# Patient Record
Sex: Female | Born: 1940 | Race: White | Hispanic: No | Marital: Married | Smoking: Never smoker
Health system: Southern US, Community
[De-identification: ages and names within clinical notes are randomized; demographics above are authoritative.]

## PROBLEM LIST (undated history)

## (undated) DIAGNOSIS — E7800 Pure hypercholesterolemia, unspecified: Secondary | ICD-10-CM

## (undated) DIAGNOSIS — M5430 Sciatica, unspecified side: Secondary | ICD-10-CM

## (undated) DIAGNOSIS — I10 Essential (primary) hypertension: Secondary | ICD-10-CM

## (undated) HISTORY — PX: CHOLECYSTECTOMY: SHX55

---

## 2011-05-04 ENCOUNTER — Emergency Department (HOSPITAL_COMMUNITY)
Admission: EM | Admit: 2011-05-04 | Discharge: 2011-05-04 | Disposition: A | Discharge status: Home / Self Care | Payer: Medicare Other | Attending: Emergency Medicine | Admitting: Emergency Medicine

## 2011-05-04 ENCOUNTER — Encounter: Payer: Self-pay | Admitting: *Deleted

## 2011-05-04 DIAGNOSIS — M545 Low back pain, unspecified: Secondary | ICD-10-CM | POA: Insufficient documentation

## 2011-05-04 DIAGNOSIS — M5432 Sciatica, left side: Secondary | ICD-10-CM

## 2011-05-04 DIAGNOSIS — E7800 Pure hypercholesterolemia, unspecified: Secondary | ICD-10-CM | POA: Insufficient documentation

## 2011-05-04 DIAGNOSIS — M79609 Pain in unspecified limb: Secondary | ICD-10-CM | POA: Insufficient documentation

## 2011-05-04 DIAGNOSIS — M25569 Pain in unspecified knee: Secondary | ICD-10-CM | POA: Insufficient documentation

## 2011-05-04 DIAGNOSIS — I10 Essential (primary) hypertension: Secondary | ICD-10-CM | POA: Insufficient documentation

## 2011-05-04 DIAGNOSIS — M5430 Sciatica, unspecified side: Secondary | ICD-10-CM | POA: Insufficient documentation

## 2011-05-04 HISTORY — DX: Essential (primary) hypertension: I10

## 2011-05-04 HISTORY — DX: Sciatica, unspecified side: M54.30

## 2011-05-04 HISTORY — DX: Pure hypercholesterolemia, unspecified: E78.00

## 2011-05-04 MED ORDER — HYDROCODONE-ACETAMINOPHEN 5-500 MG PO TABS: 1.0000 | ORAL_TABLET | Freq: Four times a day (QID) | ORAL | Status: AC | PRN

## 2011-05-04 MED ORDER — METHYLPREDNISOLONE 4 MG PO KIT: PACK | ORAL | Status: AC

## 2011-05-04 MED ORDER — HYDROCODONE-ACETAMINOPHEN 5-325 MG PO TABS
1.0000 | ORAL_TABLET | Freq: Once | ORAL | Status: AC
  Administered 2011-05-04: 1 via ORAL
  Filled 2011-05-04: qty 1

## 2011-05-04 NOTE — ED Notes (Signed)
Pt has hx if lower back pain.  Pt is getting cortisone shot in her lower back last week.  Pt states that pain came back yesterday and has been severe. Pt denies any new injury.

## 2011-05-04 NOTE — ED Provider Notes (Signed)
History     CSN: [id number] Arrival date & time: 05/04/2011 11:41 AM   First MD Initiated Contact with Patient 05/04/11 1240      Chief Complaint  Patient presents with  . Back Pain  . Leg Pain    (Consider location/radiation/quality/duration/timing/severity/associated sxs/prior treatment) Patient is a 69 y.o. female presenting with back pain and leg pain. The history is provided by the patient.  Back Pain  This is a recurrent problem. The current episode started yesterday. The problem occurs constantly. The problem has not changed since onset.The pain is associated with no known injury (Cleaning house yesterday and). The pain is present in the lumbar spine. The quality of the pain is described as stabbing and shooting. The pain radiates to the left thigh, left knee and left foot. The pain is at a severity of 8/10. The pain is moderate. The symptoms are aggravated by bending, twisting and certain positions. The pain is the same all the time. Stiffness is present all day. Associated symptoms include leg pain. Pertinent negatives include no fever, no numbness, no weight loss, no bowel incontinence, no bladder incontinence, no dysuria, no paresthesias, no tingling and no weakness. She has tried NSAIDs for the symptoms. The treatment provided no relief.  Leg Pain  Pertinent negatives include no numbness and no tingling.    Past Medical History  Diagnosis Date  . Sciatica   . Hypertension   . High cholesterol     Past Surgical History  Procedure Date  . Cholecystectomy   . Cesarean section     History reviewed. No pertinent family history.  History  Substance Use Topics  . Smoking status: Never Smoker   . Smokeless tobacco: Not on file  . Alcohol Use: No    OB History    Grav Para Term Preterm Abortions TAB SAB Ect Mult Living                  Review of Systems  Constitutional: Negative for fever and weight loss.  Gastrointestinal: Negative for bowel incontinence.    Genitourinary: Negative for bladder incontinence and dysuria.  Musculoskeletal: Positive for back pain.  Neurological: Negative for tingling, weakness, numbness and paresthesias.  All other systems reviewed and are negative.    Allergies  Review of patient's allergies indicates no known allergies.  Home Medications  No current outpatient prescriptions on file.  BP 125/61  Pulse 75  Temp(Src) 98.1 F (36.7 C) (Oral)  Resp 18  SpO2 100%  Physical Exam  Nursing note and vitals reviewed. Constitutional: She is oriented to person, place, and time. She appears well-developed and well-nourished. She appears distressed.  HENT:  Head: Normocephalic and atraumatic.  Mouth/Throat: Oropharynx is clear and moist.  Eyes: EOM are normal. Pupils are equal, round, and reactive to light.  Neck: Normal range of motion. Neck supple. No spinous process tenderness and no muscular tenderness present. No rigidity. Normal range of motion present.  Abdominal: Soft. She exhibits no distension. There is no tenderness. There is no CVA tenderness.  Musculoskeletal: She exhibits no tenderness.       Lumbar back: She exhibits tenderness and pain. She exhibits normal range of motion, no bony tenderness, no swelling, no deformity and normal pulse.  Neurological: She is alert and oriented to person, place, and time. She has normal strength. No sensory deficit. Coordination and gait normal.  Reflex Scores:      Patellar reflexes are 2+ on the right side and 2+ on the left side.  Skin: Skin is warm and dry. No rash noted.  Psychiatric: She has a normal mood and affect.    ED Course  Procedures (including critical care time)  Labs Reviewed - No data to display No results found.   No diagnosis found.    MDM   Pt with gradual onset of back pain suggestive of radiculopathy.  With a history of sciatica in the past and a cortisone injection in the back 3 years ago which relieved her pain until yesterday. No  neurovascular compromise and no incontinence.  Pt has no infectious sx, hx of CA  or other red flags concerning for pathologic back pain.  Pt is able to ambulate but is painful.  Normal strength and reflexes on exam.  Denies trauma. Will give pt pain control and to return for developement of above sx.  patient is to follow up with Dr. Magnus Ivan on Tuesday for further evaluation for cortisone injection. Will  start her on pain control and Medrol Dosepak.         Gwyneth Sprout, MD 05/04/11 1253

## 2016-03-26 ENCOUNTER — Telehealth (INDEPENDENT_AMBULATORY_CARE_PROVIDER_SITE_OTHER): Payer: Self-pay | Admitting: Radiology

## 2016-03-26 NOTE — Telephone Encounter (Signed)
Patient scheduled for appt.

## 2016-03-26 NOTE — Telephone Encounter (Signed)
Patient called, says she has a callous on her left leg and needs to see Dr Magnus IvanBlackman for this.  She wants ASAP appt, can you call her?  OK to leave message.

## 2016-04-17 ENCOUNTER — Ambulatory Visit (INDEPENDENT_AMBULATORY_CARE_PROVIDER_SITE_OTHER): Payer: Medicare Other | Admitting: Orthopaedic Surgery

## 2016-04-18 ENCOUNTER — Ambulatory Visit (INDEPENDENT_AMBULATORY_CARE_PROVIDER_SITE_OTHER): Payer: Medicare Other

## 2016-04-18 ENCOUNTER — Ambulatory Visit (INDEPENDENT_AMBULATORY_CARE_PROVIDER_SITE_OTHER): Payer: Medicare Other | Admitting: Orthopaedic Surgery

## 2016-04-18 DIAGNOSIS — M5441 Lumbago with sciatica, right side: Secondary | ICD-10-CM

## 2016-04-18 MED ORDER — METHYLPREDNISOLONE 4 MG PO TABS: ORAL_TABLET | ORAL | 0 refills | Status: DC

## 2016-04-18 NOTE — Progress Notes (Signed)
   Office Visit Note   Patient: Claire Davis           Date of Birth: Oct 07, 1940           MRN: [id number] Visit Date: 04/18/2016              Requested by: No referring provider defined for this encounter. PCP: Alton RevereELLER,EDWARD, MD   Assessment & Plan: Visit Diagnoses:  1. Acute left-sided low back pain with right-sided sciatica     Plan: Regular try combination of a steroid taper and meloxicam as well as back extension exercises. I'll see her back in about 3 weeks and of her sciatic symptoms are continuing we'll consider an MRI of her lumbar spine.  Follow-Up Instructions: Return in about 3 weeks (around 05/09/2016).   Orders:  Orders Placed This Encounter  Procedures  . XR Lumbar Spine 2-3 Views   Meds ordered this encounter  Medications  . methylPREDNISolone (MEDROL) 4 MG tablet    Sig: Medrol dose pack. Take as instructed    Dispense:  21 tablet    Refill:  0      Procedures: No procedures performed   Clinical Data: No additional findings.   Subjective: Chief Complaint  Patient presents with  . Lower Back - Pain    Patient c/o left buttocks pain that radiates to knee. She states no new injury but does have a history of back issues.  She reports pain that radiates down her backside behind her knee and into her foot. She is not a diabetic. She does not have any weakness. This is been going on for about a month. She denies any change in bowel or bladder function. He is not taking any medications for this at all.  HPI  Review of Systems Negative for bowel or bladder function changes. Negative for chest pain, headache, shortness of breath, fever, chills, nausea, vomiting  Objective: Vital Signs: There were no vitals taken for this visit.  Physical Exam He is alert and oriented 3 and in no acute distress robs discomfort Ortho Exam She gets up on the exam table easily I can fully flex and extend her lumbar spine. She has a negative straight leg raise a left side.  She is subjective numbness and pain in her backside on the left side rating down to her knee. She has intact motor and sensory function as well as reflexes in the left leg. Specialty Comments:  No specialty comments available.  Imaging: Xr Lumbar Spine 2-3 Views  Result Date: 04/18/2016 2 views of her lumbar spine shows no acute injury. She does have disc space narrowing at L4-L5 and L5-S1    PMFS History: There are no active problems to display for this patient.  Past Medical History:  Diagnosis Date  . High cholesterol   . Hypertension   . Sciatica     No family history on file.  Past Surgical History:  Procedure Laterality Date  . CESAREAN SECTION    . CHOLECYSTECTOMY     Social History   Occupational History  . Not on file.   Social History Main Topics  . Smoking status: Never Smoker  . Smokeless tobacco: Not on file  . Alcohol use No  . Drug use: Unknown  . Sexual activity: No

## 2016-05-15 ENCOUNTER — Ambulatory Visit (INDEPENDENT_AMBULATORY_CARE_PROVIDER_SITE_OTHER): Payer: 59 | Admitting: Orthopaedic Surgery

## 2016-06-04 ENCOUNTER — Ambulatory Visit (INDEPENDENT_AMBULATORY_CARE_PROVIDER_SITE_OTHER): Payer: Medicare Other | Admitting: Orthopaedic Surgery

## 2016-06-04 DIAGNOSIS — M5442 Lumbago with sciatica, left side: Secondary | ICD-10-CM

## 2016-06-04 NOTE — Progress Notes (Signed)
Patient is actually following up after we try to steroid taper and meloxicam to treat left-sided sciatica. She said steroid taper made her feel 76 years old again. She currently denies any change in bowel or bladder function. She is on meloxicam daily. She's not having any significant symptoms at all.  On exam she extends and flexes her back easily. She can touch her toes easily. She has a negative straight leg raise bilaterally. She has normal motor and sensory function her bilateral extremity's.  I do feel that her sciatica is resolving. I can always try steroid taper again down the road if needed. My next step would be to obtain an MRI of her lumbar spine if her symptoms come back. Again I showed her back extension exercises never demonstrated is back to me. I think this is helped as well. She'll follow up as needed otherwise.

## 2016-07-09 ENCOUNTER — Telehealth (INDEPENDENT_AMBULATORY_CARE_PROVIDER_SITE_OTHER): Payer: Self-pay | Admitting: Orthopaedic Surgery

## 2016-07-09 MED ORDER — METHYLPREDNISOLONE 4 MG PO TABS: ORAL_TABLET | ORAL | 0 refills | Status: DC

## 2016-07-09 MED ORDER — MELOXICAM 7.5 MG PO TABS: 7.5000 mg | ORAL_TABLET | Freq: Two times a day (BID) | ORAL | 3 refills | Status: DC | PRN

## 2016-07-09 NOTE — Telephone Encounter (Signed)
Pt requested refill of prednisone and meloxicam 7.5  To [facility] [place], [place]  [phone]  [phone]

## 2016-07-09 NOTE — Telephone Encounter (Signed)
Please advise 

## 2016-07-09 NOTE — Telephone Encounter (Signed)
ok 

## 2016-07-09 NOTE — Telephone Encounter (Signed)
Called into pharmacy

## 2016-07-12 ENCOUNTER — Ambulatory Visit (INDEPENDENT_AMBULATORY_CARE_PROVIDER_SITE_OTHER): Payer: Medicare Other | Admitting: Family

## 2016-07-12 DIAGNOSIS — M5442 Lumbago with sciatica, left side: Secondary | ICD-10-CM | POA: Diagnosis not present

## 2016-07-12 NOTE — Progress Notes (Signed)
Office Visit Note   Patient: Claire Davis           Date of Birth: 11/15/1940           MRN: [id number] Visit Date: 07/12/2016              Requested by: Alton RevereEdward Eller, MD [address] [place], [place] [id number] PCP: Alton RevereELLER,EDWARD, MD  Chief Complaint  Patient presents with  . Lower Back - Pain    HPI: Patient states that in December she was having left sided hip and leg pain and was given a 4 mg Pred taper. This was very helpful. Pt states that this Sunday morning she woke up with pain again and that she tried to " tough it out" she was given an rx for Mobic. This was not helpful. She states that she went to [facility]. She was given an rx for Prednisone 20 mg and percocet 5/325. She states that she did not take these medications because Dr. Magnus IvanBlackman had also called in an rx for prednisone and so she opted to take this instead. She does have a history of LBP and states tht she has had ESI injections with Dr. Alvester MorinNewton x 3 and that all but the last injection was helpful. Pt stats that she can not even sit on the commode because she is in such discomfort. [name], RMA  The patient is 76 year old woman who presents today for evaluation of low back pain that radiates down the left lower extremity. There is numbness in left lower extremity. Has had significant pain. States on Sunday things started flaring up again she was unable to even sit still on the commode. Did [facility] due to increased pain. She had begun taking meloxicam from Dr. Magnus IvanBlackman but this was not helpful. Did receive a prednisone taper which she has begun taking. Him states this has begun relieving her pain. Did have remote ESI injections with Dr. Alvester MorinNewton. States the first injection lasted for about 2 years however the last injection early last year was not very helpful. No loss of control of bowel or bladder    Assessment & Plan: Visit Diagnoses:  1. Acute left-sided  low back pain with left-sided sciatica     Plan: We will proceed with MRI of the lumbar spine. She'll follow up in office pending results.  Follow-Up Instructions: Return for p MRI .   Ortho Exam Physical Exam  Constitutional: Appears well-developed.  Head: Normocephalic.  Eyes: EOM are normal.  Neck: Normal range of motion.  Cardiovascular: Normal rate.   Pulmonary/Chest: Effort normal.  Neurological: Is alert.  Skin: Skin is warm.  Psychiatric: Has a normal mood and affect. Low back: she has no spinous process tenderness. Does not have a positive right leg raise on the left. Him no weakness. Does complain of numbness and shooting pain all the way down to the foot down the lateral Imaging: No results found.  Orders:  Orders Placed This Encounter  Procedures  . MR LUMBAR SPINE WO CONTRAST   No orders of the defined types were placed in this encounter.    Procedures: No procedures performed  Clinical Data: No additional findings.  Subjective: Review of Systems  Constitutional: Negative for chills and fever.  Musculoskeletal: Positive for back pain.  Neurological: Positive for numbness. Negative for weakness.    Objective: Vital Signs: There were no vitals taken for this visit.  Specialty Comments:  No specialty  comments available.  PMFS History: Patient Active Problem List   Diagnosis Date Noted  . Lumbago with sciatica, left side 07/12/2016   Past Medical History:  Diagnosis Date  . High cholesterol   . Hypertension   . Sciatica     No family history on file.  Past Surgical History:  Procedure Laterality Date  . CESAREAN SECTION    . CHOLECYSTECTOMY     Social History   Occupational History  . Not on file.   Social History Main Topics  . Smoking status: Never Smoker  . Smokeless tobacco: Not on file  . Alcohol use No  . Drug use: Unknown  . Sexual activity: No

## 2016-07-22 ENCOUNTER — Ambulatory Visit (HOSPITAL_COMMUNITY)
Admission: RE | Admit: 2016-07-22 | Discharge: 2016-07-22 | Disposition: A | Discharge status: Home / Self Care | Payer: Medicare Other | Source: Ambulatory Visit | Attending: Family | Admitting: Family

## 2016-07-22 ENCOUNTER — Other Ambulatory Visit (INDEPENDENT_AMBULATORY_CARE_PROVIDER_SITE_OTHER): Payer: Self-pay | Admitting: Family

## 2016-07-22 DIAGNOSIS — M5442 Lumbago with sciatica, left side: Secondary | ICD-10-CM

## 2016-07-22 DIAGNOSIS — M5136 Other intervertebral disc degeneration, lumbar region: Secondary | ICD-10-CM | POA: Diagnosis not present

## 2016-07-22 DIAGNOSIS — M48061 Spinal stenosis, lumbar region without neurogenic claudication: Secondary | ICD-10-CM | POA: Insufficient documentation

## 2016-07-25 ENCOUNTER — Other Ambulatory Visit (INDEPENDENT_AMBULATORY_CARE_PROVIDER_SITE_OTHER): Payer: Self-pay

## 2016-07-25 ENCOUNTER — Ambulatory Visit (INDEPENDENT_AMBULATORY_CARE_PROVIDER_SITE_OTHER): Payer: Medicare Other | Admitting: Physician Assistant

## 2016-07-25 DIAGNOSIS — M5442 Lumbago with sciatica, left side: Principal | ICD-10-CM

## 2016-07-25 DIAGNOSIS — G8929 Other chronic pain: Secondary | ICD-10-CM

## 2016-07-25 DIAGNOSIS — M5441 Lumbago with sciatica, right side: Principal | ICD-10-CM

## 2016-07-25 MED ORDER — TRAMADOL HCL 50 MG PO TABS: 50.0000 mg | ORAL_TABLET | Freq: Four times a day (QID) | ORAL | 0 refills | Status: AC | PRN

## 2016-07-25 NOTE — Progress Notes (Signed)
   Office Visit Note   Patient: Claire Davis           Date of Birth: 11-24-1940           MRN: [id number] Visit Date: 07/25/2016              Requested by: Alton RevereEdward Eller, MD [address] [place], [place] [id number] PCP: Alton RevereELLER,EDWARD, MD   Assessment & Plan: Visit Diagnoses: No diagnosis found.  Plan: We will send her for epidural steroid injection lumbar spine. Follow-up Dr. Magnus IvanBlackman one-month status post injection .  Follow-Up Instructions: Return in about 4 weeks (around 08/22/2016).   Orders:  No orders of the defined types were placed in this encounter.  Meds ordered this encounter  Medications  . traMADol (ULTRAM) 50 MG tablet    Sig: Take 1 tablet (50 mg total) by mouth every 6 (six) hours as needed.    Dispense:  30 tablet    Refill:  0      Procedures: No procedures performed   Clinical Data: No additional findings.   Subjective: No chief complaint on file.   HPI Mrs. Claire Davis returns today follow-up status post MRI of her lumbar spine which was performed on 07/22/2016. She continues to have pain down the lateral aspect of her left leg in the lateral foot region. He states that she actually had to go to the emergency room I do not have any records of this due to her low back pain and radicular symptoms down the left leg. She is asking for some type of pain medication until she can get further treatment for this. She denies any symptoms down the right leg. MR eyes reviewed with the patient using a spinal model for visualization purposes. MRI showed L4-L5 advanced spinalcysts and advanced right and mild to moderate left foraminal narrowing L3-L4 moderate left foraminal narrowing mild stenosis L4-5 facet arthropathy with left more than right subarticular recess stenosis. Were patent at the L5-S1 level. Review of Systems   Objective: Vital Signs: There were no vitals taken for this visit.  Physical Exam  Ortho Exam  Specialty Comments:  No specialty  comments available.  Imaging: No results found.   PMFS History: Patient Active Problem List   Diagnosis Date Noted  . Lumbago with sciatica, left side 07/12/2016   Past Medical History:  Diagnosis Date  . High cholesterol   . Hypertension   . Sciatica     No family history on file.  Past Surgical History:  Procedure Laterality Date  . CESAREAN SECTION    . CHOLECYSTECTOMY     Social History   Occupational History  . Not on file.   Social History Main Topics  . Smoking status: Never Smoker  . Smokeless tobacco: Not on file  . Alcohol use No  . Drug use: Unknown  . Sexual activity: No

## 2016-07-26 ENCOUNTER — Other Ambulatory Visit (INDEPENDENT_AMBULATORY_CARE_PROVIDER_SITE_OTHER): Payer: Self-pay | Admitting: Physician Assistant

## 2016-07-26 DIAGNOSIS — M545 Low back pain: Principal | ICD-10-CM

## 2016-07-26 DIAGNOSIS — G8929 Other chronic pain: Secondary | ICD-10-CM

## 2016-07-30 ENCOUNTER — Telehealth (INDEPENDENT_AMBULATORY_CARE_PROVIDER_SITE_OTHER): Payer: Self-pay | Admitting: *Deleted

## 2016-07-30 NOTE — Telephone Encounter (Signed)
[facility]30[facility], per fax pt is aware of appt

## 2016-08-05 ENCOUNTER — Ambulatory Visit
Admission: RE | Admit: 2016-08-05 | Discharge: 2016-08-05 | Disposition: A | Discharge status: Home / Self Care | Payer: Medicare Other | Source: Ambulatory Visit | Attending: Physician Assistant | Admitting: Physician Assistant

## 2016-08-05 VITALS — BP 119/58 | HR 67

## 2016-08-05 DIAGNOSIS — M545 Low back pain: Secondary | ICD-10-CM

## 2016-08-05 DIAGNOSIS — M5442 Lumbago with sciatica, left side: Principal | ICD-10-CM

## 2016-08-05 DIAGNOSIS — G8929 Other chronic pain: Secondary | ICD-10-CM

## 2016-08-05 MED ORDER — METHYLPREDNISOLONE ACETATE 40 MG/ML INJ SUSP (RADIOLOG
120.0000 mg | Freq: Once | INTRAMUSCULAR | Status: AC
  Administered 2016-08-05: 120 mg via EPIDURAL

## 2016-08-05 MED ORDER — IOPAMIDOL (ISOVUE-M 200) INJECTION 41%
1.0000 mL | Freq: Once | INTRAMUSCULAR | Status: AC
  Administered 2016-08-05: 1 mL via EPIDURAL

## 2016-08-05 NOTE — Discharge Instructions (Signed)

## 2016-08-22 ENCOUNTER — Encounter (INDEPENDENT_AMBULATORY_CARE_PROVIDER_SITE_OTHER): Payer: Self-pay | Admitting: Orthopaedic Surgery

## 2016-08-22 ENCOUNTER — Ambulatory Visit (INDEPENDENT_AMBULATORY_CARE_PROVIDER_SITE_OTHER): Payer: Medicare Other | Admitting: Orthopaedic Surgery

## 2016-08-22 DIAGNOSIS — M5442 Lumbago with sciatica, left side: Secondary | ICD-10-CM | POA: Diagnosis not present

## 2016-08-22 NOTE — Progress Notes (Signed)
Ms. Claire Davis is following up after an epidural steroid injection at the left lower aspect of her lumbar spine [facility] about 2 weeks ago. She says she is doing great. The injection helped about 85%. She is having significant radicular sciatic symptoms before. She says waking up in the morning she has some pain in the left hip area but overall she doing much better. She's been working on back extension exercises and taking tramadol and meloxicam as needed.  On examination basically her back and lower extremity exam is normal today. She it's row without assistive device and gets up on the exam table easily. She has excellent strength in her bilateral lower extremities no significant weakness at all. There is no numbness and tingling. Her straight leg raise is negative.  At this point she'll follow-up as needed. She can always call to have another injection scheduled if she has any problems at all.

## 2017-07-19 ENCOUNTER — Other Ambulatory Visit (INDEPENDENT_AMBULATORY_CARE_PROVIDER_SITE_OTHER): Payer: Self-pay | Admitting: Orthopaedic Surgery

## 2017-07-21 NOTE — Telephone Encounter (Signed)
Please advise, last appt was 08/2016

## 2017-07-29 IMAGING — XA Imaging study
2 series · 2 of 2 positions shown · non-contrast
Comparison: none

CLINICAL DATA: Lumbosacral spondylosis without myelopathy.
Left-sided low back and left lower extremity pain. Severe
multifactorial spinal stenosis at L4-5 on MRI.

[Series 1: ortho standard · 1 of 1 slices shown (1 of 2)]
[im 1/1]
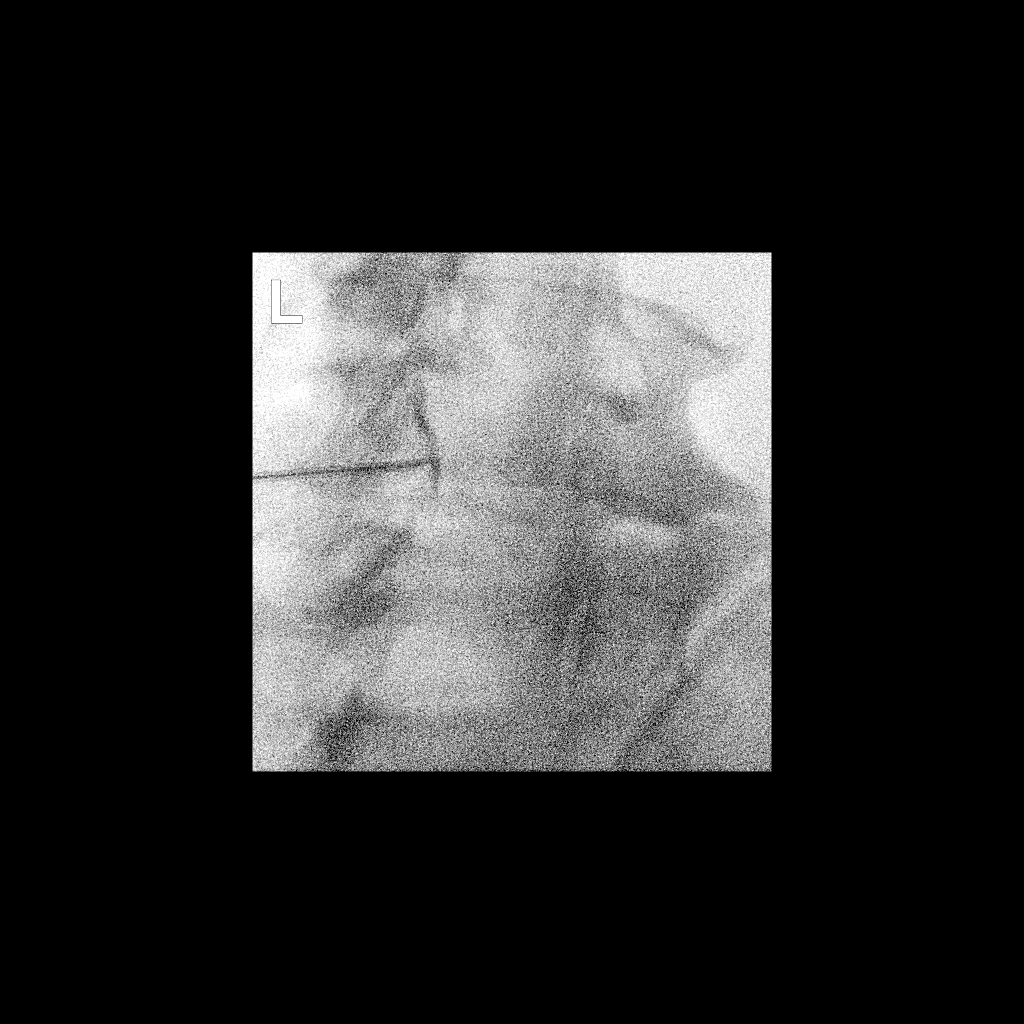

[Series 2: ortho standard · 1 of 1 slices shown (2 of 2)]
[im 1/1]
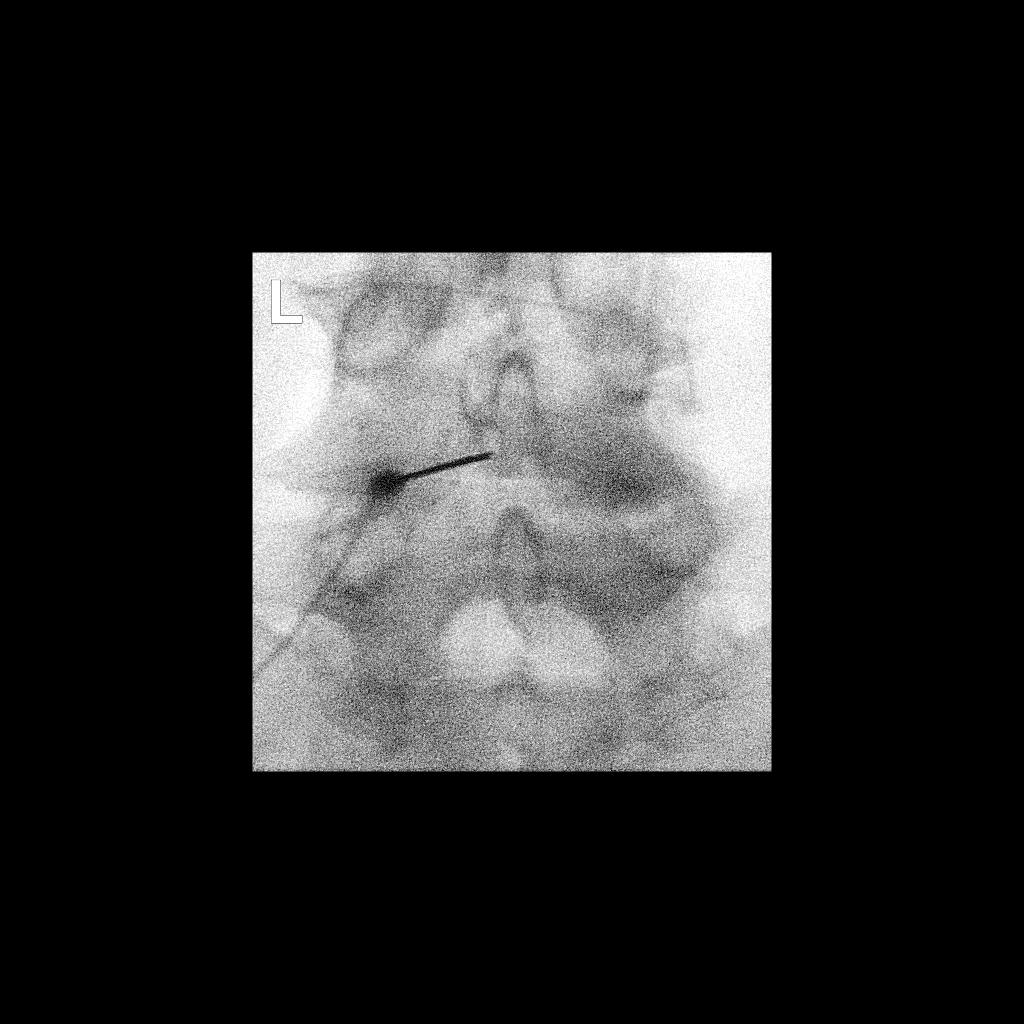

[2 of 2 positions shown; findings below may reference images not displayed]

FLUOROSCOPY TIME:  Radiation Exposure Index (as provided by the
fluoroscopic device): 9.96 microGray*m^2

Fluoroscopy Time (in minutes and seconds):  7 seconds

PROCEDURE:
The procedure, risks, benefits, and alternatives were explained to
the patient. Questions regarding the procedure were encouraged and
answered. The patient understands and consents to the procedure.

LUMBAR EPIDURAL INJECTION:

An interlaminar approach was performed on the left at L4-5. The
overlying skin was cleansed and anesthetized. A 3.5 inch 20 gauge
epidural needle was advanced using loss-of-resistance technique.

DIAGNOSTIC EPIDURAL INJECTION:

Injection of Isovue-M 200 shows a good epidural pattern with spread
above and below the level of needle placement, primarily on the left
no vascular opacification is seen.

THERAPEUTIC EPIDURAL INJECTION:

120 mg of Depo-Medrol mixed with 3 mL of 1% lidocaine were
instilled. The procedure was well-tolerated, and the patient was
discharged thirty minutes following the injection in good condition.

COMPLICATIONS:
None
IMPRESSION: Technically successful lumbar interlaminar epidural injection on the
left at L4-5.

## 2018-04-14 ENCOUNTER — Telehealth (INDEPENDENT_AMBULATORY_CARE_PROVIDER_SITE_OTHER): Payer: Self-pay | Admitting: Orthopaedic Surgery

## 2018-04-14 ENCOUNTER — Other Ambulatory Visit (INDEPENDENT_AMBULATORY_CARE_PROVIDER_SITE_OTHER): Payer: Self-pay

## 2018-04-14 DIAGNOSIS — M5442 Lumbago with sciatica, left side: Secondary | ICD-10-CM

## 2018-04-14 NOTE — Telephone Encounter (Signed)
Patient called stated she was told by Dr Magnus IvanBlackman if she needed another injection @ GI to call and we would make the appointment.  Please call patient to advise.

## 2018-04-14 NOTE — Telephone Encounter (Signed)
Order sent.

## 2018-04-16 ENCOUNTER — Other Ambulatory Visit (INDEPENDENT_AMBULATORY_CARE_PROVIDER_SITE_OTHER): Payer: Self-pay

## 2018-04-16 ENCOUNTER — Telehealth (INDEPENDENT_AMBULATORY_CARE_PROVIDER_SITE_OTHER): Payer: Self-pay | Admitting: Orthopaedic Surgery

## 2018-04-16 DIAGNOSIS — M5442 Lumbago with sciatica, left side: Secondary | ICD-10-CM

## 2018-04-16 NOTE — Telephone Encounter (Signed)
I put order in the chart

## 2018-05-04 ENCOUNTER — Ambulatory Visit (INDEPENDENT_AMBULATORY_CARE_PROVIDER_SITE_OTHER): Payer: Medicare Other | Admitting: Physical Medicine and Rehabilitation

## 2018-05-04 ENCOUNTER — Ambulatory Visit (INDEPENDENT_AMBULATORY_CARE_PROVIDER_SITE_OTHER): Payer: Self-pay

## 2018-05-04 ENCOUNTER — Encounter (INDEPENDENT_AMBULATORY_CARE_PROVIDER_SITE_OTHER): Payer: Self-pay | Admitting: Physical Medicine and Rehabilitation

## 2018-05-04 VITALS — BP 131/79 | HR 70 | Temp 98.0°F

## 2018-05-04 DIAGNOSIS — M48062 Spinal stenosis, lumbar region with neurogenic claudication: Secondary | ICD-10-CM | POA: Diagnosis not present

## 2018-05-04 DIAGNOSIS — M5416 Radiculopathy, lumbar region: Secondary | ICD-10-CM | POA: Diagnosis not present

## 2018-05-04 MED ORDER — BETAMETHASONE SOD PHOS & ACET 6 (3-3) MG/ML IJ SUSP
12.0000 mg | Freq: Once | INTRAMUSCULAR | Status: AC
  Administered 2018-05-04: 12 mg

## 2018-05-04 NOTE — Patient Instructions (Signed)

## 2018-05-04 NOTE — Progress Notes (Signed)
 .  Numeric Pain Rating Scale and Functional Assessment Average Pain 5   In the last MONTH (on 0-10 scale) has pain interfered with the following?  1. General activity like being  able to carry out your everyday physical activities such as walking, climbing stairs, carrying groceries, or moving a chair?  Rating(4)   +Driver, -BT, -Dye Allergies.  

## 2018-05-06 NOTE — Progress Notes (Signed)
Claire Davis - 77 y.o. female MRN [id number]  Date of birth: [birth year]  Office Visit Note: Visit Date: 05/04/2018 PCP: Alton RevereEller, Edward, MD Referred by: Alton RevereEller, Edward, MD  Subjective: Chief Complaint  Patient presents with  . Lower Back - Pain  . Left Hip - Pain  . Left Leg - Pain   HPI:  Claire Hongatsy Authier is a 77 y.o. female who comes in today Planned left L4 transforaminal epidural steroid injection at the request of Doneen Poissonhristopher Blackman, M.D. and Rexene EdisonGil Clark, PA-C.  We have seen Ms. Norgard in the past for similar complaints of left radicular leg pain do to severe stenosis at L4-5.  It appears that since I have seen her last she has had updated MRI which is in the chart and reviewed with her today.  This does confirm continued severe stenosis.  It appears that she had an injection [facility] in March which did give her some relief.  We are going to complete diagnostic and hopefully therapeutic left L4 transforaminal injection today.   imaging injection was an interlaminar injection which is pretty significant for that level of stenosis.  I do not like to perform interlaminar injections with that small of the canal setting.  ROS Otherwise per HPI.  Assessment & Plan: Visit Diagnoses:  1. Lumbar radiculopathy   2. Spinal stenosis of lumbar region with neurogenic claudication     Plan: No additional findings.   Meds & Orders:  Meds ordered this encounter  Medications  . betamethasone acetate-betamethasone sodium phosphate (CELESTONE) injection 12 mg    Orders Placed This Encounter  Procedures  . XR C-ARM NO REPORT  . Epidural Steroid injection    Follow-up: Return if symptoms worsen or fail to improve.   Procedures: No procedures performed  No notes on file   Clinical History: MRI LUMBAR SPINE WITHOUT CONTRAST  TECHNIQUE: Multiplanar, multisequence MR imaging of the lumbar spine was performed. No intravenous contrast was  administered.  COMPARISON:  None.  FINDINGS: Segmentation:  Standard.  Alignment:  Grade 1 anterolisthesis at L4-5, facet mediated.  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  Conus medullaris: Extends to the T12 level and appears normal.  Paraspinal and other soft tissues: Negative.  Disc levels:  T12- L1: Disc narrowing and desiccation.  No impingement  L1-L2: Mild disc narrowing and bulging. Ventral spurring. No impingement  L2-L3: Bi foraminal predominant disc bulging. Degenerative posterior element hypertrophy. No compressive stenosis  L3-L4: Disc bulging with left foraminal inferior protrusion. Hypertrophic facet arthropathy. Mild spinal stenosis. Asymmetric left subarticular recess narrowing. Left inferior foraminal effacement without L3 compression.  L4-L5: Severe facet arthropathy with trace anterolisthesis and bulky posterior element hypertrophy. The disc is bulging and narrowed. Advanced spinal stenosis with no visible CSF and cauda equina redundancy. Advanced right and mild/ moderate left foraminal narrowing.  L5-S1:Disc narrowing and bulging. Facet arthropathy with asymmetric left spurring. Left more than right subarticular recess stenosis. Patent foramina.  IMPRESSION: 1. 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 2. 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 3. L3-4 moderate left foraminal narrowing. 4. L5-S1 subarticular recess narrowing which could affect either of the S1 nerve roots.   Electronically Signed   By: Marnee SpringJonathon  Watts M.D.   On: 07/22/2016 16:34     Objective:  VS:  HT:    WT:   BMI:     BP:131/79  HR:70bpm  TEMP:98 F (36.7 C)(Oral)  RESP:  Physical Exam  Ortho Exam  Imaging: No results found.

## 2018-08-12 ENCOUNTER — Ambulatory Visit (INDEPENDENT_AMBULATORY_CARE_PROVIDER_SITE_OTHER): Payer: Medicare Other | Admitting: Orthopaedic Surgery

## 2018-08-12 ENCOUNTER — Other Ambulatory Visit: Payer: Self-pay

## 2018-08-12 ENCOUNTER — Encounter (INDEPENDENT_AMBULATORY_CARE_PROVIDER_SITE_OTHER): Payer: Self-pay | Admitting: Orthopaedic Surgery

## 2018-08-12 DIAGNOSIS — M542 Cervicalgia: Secondary | ICD-10-CM | POA: Diagnosis not present

## 2018-08-12 DIAGNOSIS — M5010 Cervical disc disorder with radiculopathy, unspecified cervical region: Secondary | ICD-10-CM

## 2018-08-12 NOTE — Progress Notes (Signed)
The patient is someone I am seeing in the past.  She comes with a history of left shoulder pain but what she describes more is neck pain with radicular symptoms going down her left arm into her hand with numbness and tingling.  She actually had an MRI done through [facility] rocking him.  I was able to pull it up on the canopy system.  She reports that she has shoulder pain but she is able to move her shoulder around easily and has no pain in her shoulder on my exam today.  Her shoulder exam is basically benign today except for just a little bit of grinding but it is minimal on her left shoulder.  She does have a positive Spurling sign to the left side.  She has subjective numbness in C6 of her left upper extremity.  There is not true weakness in her arm but is definitely uncomfortable to her.  I was able to look at the MRI with her and showed her the MRI.  She does have significant foraminal stenosis at C5-C6 and moderate foraminal stenosis at C6-C7 both to the left.  This is from a disc osteophyte complex at both areas.  I gave her reassurance that I think her shoulder is not an issue but certainly what she is feeling in her left upper extremity is most likely coming from her neck.  I would like to have her get a follow-up appointment with my partner Dr. Ophelia Charter to see what his opinion would be in terms of whether or not she would benefit from a surgical intervention of her cervical spine versus even considering an injection which would be likely done at a lower level of the cervical spine by Dr. Alvester Morin.  She would like to see Dr. Ophelia Charter when he is in his clinic in [place].  We will work on getting that scheduled.

## 2018-08-27 ENCOUNTER — Other Ambulatory Visit: Payer: Self-pay

## 2018-08-27 ENCOUNTER — Ambulatory Visit (INDEPENDENT_AMBULATORY_CARE_PROVIDER_SITE_OTHER): Payer: Medicare Other | Admitting: Orthopaedic Surgery

## 2018-08-27 ENCOUNTER — Encounter (INDEPENDENT_AMBULATORY_CARE_PROVIDER_SITE_OTHER): Payer: Self-pay | Admitting: Orthopaedic Surgery

## 2018-08-27 VITALS — Ht 59.0 in | Wt 126.0 lb

## 2018-08-27 DIAGNOSIS — M4722 Other spondylosis with radiculopathy, cervical region: Secondary | ICD-10-CM | POA: Diagnosis not present

## 2018-08-27 DIAGNOSIS — M79602 Pain in left arm: Secondary | ICD-10-CM | POA: Diagnosis not present

## 2018-08-27 DIAGNOSIS — M542 Cervicalgia: Secondary | ICD-10-CM | POA: Diagnosis not present

## 2018-08-27 DIAGNOSIS — M48062 Spinal stenosis, lumbar region with neurogenic claudication: Secondary | ICD-10-CM | POA: Diagnosis not present

## 2018-08-27 NOTE — Progress Notes (Signed)
Office Visit Note   Patient: Claire Davis           Date of Birth: Aug 06, 1940           MRN: [id number] Visit Date: 08/27/2018              Requested by: Alton Revere, MD [address] [place], [place] [address] PCP: Alton Revere, MD   Assessment & Plan: Visit Diagnoses:  1. Other spondylosis with radiculopathy, cervical region   2. Spinal stenosis of lumbar region with neurogenic claudication     Plan: We reviewed the MRI scan I gave her a copy of the report.  We will check her back again in 6 weeks set her up for some physical therapy evaluation and treatment of her cervical spondylosis.  We discussed options for this and we can discuss her lumbar spinal stenosis on return as well.  Follow-Up Instructions: Return in about 6 weeks (around 10/08/2018).   Orders:  Orders Placed This Encounter  Procedures   Ambulatory referral to Physical Therapy   No orders of the defined types were placed in this encounter.     Procedures: No procedures performed   Clinical Data: No additional findings.   Subjective: Chief Complaint  Patient presents with   Neck - Pain    HPI 78 year old female seen with neck and left arm pain referred by Dr. Magnus Ivan.  She been treated in the past for severe L4-5 stenosis with neurogenic claudication.  She has had neck pain and tingling with numbness in the fingers that been present for 6 weeks.  She had been on some Mobic which she thinks is improved her symptoms.  She states it is about 50% better than it was when she saw him.  Patient's numbness was in the left arm in the C6 distribution.  She is not noticed any weakness.  No falling or stumbling.  She does have the neurogenic claudication from lumbar stenosis.  Patient had a cervical MRI scan done had more head [facility] cervical spine on 07/27/2018 which shows leftward disc osteophyte complex with severe left foraminal stenosis C5-6.  Moderate central and right foraminal narrowing  at C5-6 and moderate left narrowing at C6-7.  Review of Systems positive for cervical spondylosis with radicular left arm pain and L4-5 severe spinal stenosis with neurogenic claudication.  Patient wears glasses.  Otherwise 14 point review of systems noncontributory.   Objective: Vital Signs: Ht 4\' 11"  (1.499 m)    Wt 126 lb (57.2 kg)    BMI 25.45 kg/m   Physical Exam Constitutional:      Appearance: She is well-developed.  HENT:     Head: Normocephalic.     Right Ear: External ear normal.     Left Ear: External ear normal.  Eyes:     Pupils: Pupils are equal, round, and reactive to light.     Comments: Glasses.   Neck:     Thyroid: No thyromegaly.     Trachea: No tracheal deviation.  Cardiovascular:     Rate and Rhythm: Normal rate.  Pulmonary:     Effort: Pulmonary effort is normal.  Abdominal:     Palpations: Abdomen is soft.  Skin:    General: Skin is warm and dry.  Neurological:     Mental Status: She is alert and oriented to person, place, and time.  Psychiatric:        Behavior: Behavior normal.     Ortho Exam   Specialty Comments:  No specialty comments available.  Imaging: No results found.   PMFS History: Patient Active Problem List   Diagnosis Date Noted   Spinal stenosis of lumbar region with neurogenic claudication 08/27/2018   Other spondylosis with radiculopathy, cervical region 08/27/2018   Lumbago with sciatica, left side 07/12/2016   Past Medical History:  Diagnosis Date   High cholesterol    Hypertension    Sciatica     No family history on file.  Past Surgical History:  Procedure Laterality Date   CESAREAN SECTION     CHOLECYSTECTOMY     Social History   Occupational History   Not on file  Tobacco Use   Smoking status: Never Smoker   Smokeless tobacco: Never Used  Substance and Sexual Activity   Alcohol use: No   Drug use: Not on file   Sexual activity: Never

## 2018-12-30 ENCOUNTER — Telehealth: Payer: Self-pay | Admitting: Orthopaedic Surgery

## 2018-12-30 MED ORDER — METHYLPREDNISOLONE 4 MG PO TABS: ORAL_TABLET | ORAL | 0 refills | Status: DC

## 2018-12-30 MED ORDER — METHYLPREDNISOLONE 4 MG PO TABS: ORAL_TABLET | ORAL | 0 refills | Status: AC

## 2018-12-30 NOTE — Telephone Encounter (Signed)
Please advise 

## 2018-12-30 NOTE — Telephone Encounter (Signed)
Patient called and stated that wanted some Medrol her leg is giving her a fit with pain.  Please call patient to advise [phone]  Want it called in to [place]  [phone]

## 2019-01-07 ENCOUNTER — Telehealth: Payer: Self-pay | Admitting: Physical Medicine and Rehabilitation

## 2019-01-07 NOTE — Telephone Encounter (Signed)
Offer her repeat of last when we can, or if not soon enough then [facility] ok, she had one there prior to our last injection. She had L4-5 interlaminar despite MRI of severe stenosis at that level. We did a trans.

## 2019-01-07 NOTE — Telephone Encounter (Signed)
Worked in for [facility] on 8/17 at 1415 with driver.

## 2019-01-18 ENCOUNTER — Encounter: Payer: Self-pay | Admitting: Physical Medicine and Rehabilitation

## 2019-01-18 ENCOUNTER — Ambulatory Visit: Payer: Self-pay

## 2019-01-18 ENCOUNTER — Ambulatory Visit (INDEPENDENT_AMBULATORY_CARE_PROVIDER_SITE_OTHER): Payer: Medicare Other | Admitting: Physical Medicine and Rehabilitation

## 2019-01-18 VITALS — BP 120/62 | HR 64

## 2019-01-18 DIAGNOSIS — M5416 Radiculopathy, lumbar region: Secondary | ICD-10-CM | POA: Diagnosis not present

## 2019-01-18 DIAGNOSIS — M48062 Spinal stenosis, lumbar region with neurogenic claudication: Secondary | ICD-10-CM

## 2019-01-18 MED ORDER — BETAMETHASONE SOD PHOS & ACET 6 (3-3) MG/ML IJ SUSP
12.0000 mg | Freq: Once | INTRAMUSCULAR | Status: AC
  Administered 2019-01-18: 12 mg

## 2019-01-18 NOTE — Progress Notes (Signed)
Numeric Pain Rating Scale and Functional Assessment Average Pain 9   In the last MONTH (on 0-10 scale) has pain interfered with the following?  1. General activity like being  able to carry out your everyday physical activities such as walking, climbing stairs, carrying groceries, or moving a chair?  Rating(7)   +Driver, -BT, -Dye Allergies. 

## 2019-01-19 NOTE — Progress Notes (Signed)
Claire Davis - 78 y.o. female MRN [id number]  Date of birth: [birth year]  Office Visit Note: Visit Date: 01/18/2019 PCP: Burton Apley, MD Referred by: Burton Apley, MD  Subjective: Chief Complaint  Patient presents with  . Lower Back - Pain  . Left Leg - Pain   HPI:  Claire Davis is a 78 y.o. female who comes in today For planned repeat left L4 transforaminal epidural steroid injection with history of recurrent low back and left radicular leg pain in a setting of severe multifactorial stenosis at L4-5.  She reports that a couple of weeks ago the pain medic crescendo of increasing pain over the last several weeks but now it has actually eased to a degree.  Even though she reports that it has eased to a degree she reports 9 out of 10 pain.  She reports that refers from the lower back into the left buttock and posterior lateral leg all the way to the foot and ankle.  This is a pretty classic L5 distribution.  She reports worsening at night with laying down and worsening with walking.  She gets some pain relief sitting.  She does use Tylenol and does not really tolerate other medications.  She is also had ongoing work lately with her cervical spine with physical therapy and management through a different physician.  She actually saw Dr. Lorin Mercy for evaluation of her cervical spine.  She has had no other red flag complaints she has had no trauma.  She has had no foot drop or weakness or bowel bladder changes.  We will go ahead and complete left L4 transforaminal injection diagnostically and hopefully therapeutically.  Last injection was in December and it helped her quite a bit.  ROS Otherwise per HPI.  Assessment & Plan: Visit Diagnoses:  1. Lumbar radiculopathy   2. Spinal stenosis of lumbar region with neurogenic claudication     Plan: No additional findings.   Meds & Orders:  Meds ordered this encounter  Medications  . betamethasone acetate-betamethasone sodium phosphate (CELESTONE)  injection 12 mg    Orders Placed This Encounter  Procedures  . XR C-ARM NO REPORT  . Epidural Steroid injection    Follow-up: No follow-ups on file.   Procedures: No procedures performed  Lumbosacral Transforaminal Epidural Steroid Injection - Sub-Pedicular Approach with Fluoroscopic Guidance  Patient: Claire Davis      Date of Birth: 1940/06/09 MRN: [id number] PCP: Burton Apley, MD      Visit Date: 01/18/2019   Universal Protocol:    Date/Time: 01/18/2019  Consent Given By: the patient  Position: PRONE  Additional Comments: Vital signs were monitored before and after the procedure. Patient was prepped and draped in the usual sterile fashion. The correct patient, procedure, and site was verified.   Injection Procedure Details:  Procedure Site One Meds Administered:  Meds ordered this encounter  Medications  . betamethasone acetate-betamethasone sodium phosphate (CELESTONE) injection 12 mg    Laterality: Left  Location/Site:  L4-L5  Needle size: 22 G  Needle type: Spinal  Needle Placement: Transforaminal  Findings:    -Comments: Excellent flow of contrast along the nerve and into the epidural space.  Procedure Details: After squaring off the end-plates to get a true AP view, the C-arm was positioned so that an oblique view of the foramen as noted above was visualized. The target area is just inferior to the "nose of the scotty dog" or sub pedicular. The soft tissues overlying this structure were infiltrated with 2-3 ml.  of 1% Lidocaine without Epinephrine.  The spinal needle was inserted toward the target using a "trajectory" view along the fluoroscope beam.  Under AP and lateral visualization, the needle was advanced so it did not puncture dura and was located close the 6 O'Clock position of the pedical in AP tracterory. Biplanar projections were used to confirm position. Aspiration was confirmed to be negative for CSF and/or blood. A 1-2 ml. volume of  Isovue-250 was injected and flow of contrast was noted at each level. Radiographs were obtained for documentation purposes.   After attaining the desired flow of contrast documented above, a 0.5 to 1.0 ml test dose of 0.25% Marcaine was injected into each respective transforaminal space.  The patient was observed for 90 seconds post injection.  After no sensory deficits were reported, and normal lower extremity motor function was noted,   the above injectate was administered so that equal amounts of the injectate were placed at each foramen (level) into the transforaminal epidural space.   Additional Comments:  The patient tolerated the procedure well Dressing: 2 x 2 sterile gauze and Band-Aid    Post-procedure details: Patient was observed during the procedure. Post-procedure instructions were reviewed.  Patient left the clinic in stable condition.     Clinical History: MRI LUMBAR SPINE WITHOUT CONTRAST  TECHNIQUE: Multiplanar, multisequence MR imaging of the lumbar spine was performed. No intravenous contrast was administered.  COMPARISON:  None.  FINDINGS: Segmentation:  Standard.  Alignment:  Grade 1 anterolisthesis at L4-5, facet mediated.  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  Conus medullaris: Extends to the T12 level and appears normal.  Paraspinal and other soft tissues: Negative.  Disc levels:  T12- L1: Disc narrowing and desiccation.  No impingement  L1-L2: Mild disc narrowing and bulging. Ventral spurring. No impingement  L2-L3: Bi foraminal predominant disc bulging. Degenerative posterior element hypertrophy. No compressive stenosis  L3-L4: Disc bulging with left foraminal inferior protrusion. Hypertrophic facet arthropathy. Mild spinal stenosis. Asymmetric left subarticular recess narrowing. Left inferior foraminal effacement without L3 compression.  L4-L5: Severe facet arthropathy with trace anterolisthesis and bulky posterior element  hypertrophy. The disc is bulging and narrowed. Advanced spinal stenosis with no visible CSF and cauda equina redundancy. Advanced right and mild/ moderate left foraminal narrowing.  L5-S1:Disc narrowing and bulging. Facet arthropathy with asymmetric left spurring. Left more than right subarticular recess stenosis. Patent foramina.  IMPRESSION: 1. 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 2. 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 3. L3-4 moderate left foraminal narrowing. 4. L5-S1 subarticular recess narrowing which could affect either of the S1 nerve roots.   Electronically Signed   By: Marnee SpringJonathon  Watts M.D.   On: 07/22/2016 16:34     Objective:  VS:  HT:    WT:   BMI:     BP:120/62  HR:64bpm  TEMP: ( )  RESP:  Physical Exam  Ortho Exam Imaging: Xr C-arm No Report  Result Date: 01/18/2019 Please see Notes tab for imaging impression.

## 2019-01-19 NOTE — Procedures (Signed)
Lumbosacral Transforaminal Epidural Steroid Injection - Sub-Pedicular Approach with Fluoroscopic Guidance  Patient: Claire Davis      Date of Birth: June 16, 1940 MRN: [id number] PCP: Burton Apley, MD      Visit Date: 01/18/2019   Universal Protocol:    Date/Time: 01/18/2019  Consent Given By: the patient  Position: PRONE  Additional Comments: Vital signs were monitored before and after the procedure. Patient was prepped and draped in the usual sterile fashion. The correct patient, procedure, and site was verified.   Injection Procedure Details:  Procedure Site One Meds Administered:  Meds ordered this encounter  Medications  . betamethasone acetate-betamethasone sodium phosphate (CELESTONE) injection 12 mg    Laterality: Left  Location/Site:  L4-L5  Needle size: 22 G  Needle type: Spinal  Needle Placement: Transforaminal  Findings:    -Comments: Excellent flow of contrast along the nerve and into the epidural space.  Procedure Details: After squaring off the end-plates to get a true AP view, the C-arm was positioned so that an oblique view of the foramen as noted above was visualized. The target area is just inferior to the "nose of the scotty dog" or sub pedicular. The soft tissues overlying this structure were infiltrated with 2-3 ml. of 1% Lidocaine without Epinephrine.  The spinal needle was inserted toward the target using a "trajectory" view along the fluoroscope beam.  Under AP and lateral visualization, the needle was advanced so it did not puncture dura and was located close the 6 O'Clock position of the pedical in AP tracterory. Biplanar projections were used to confirm position. Aspiration was confirmed to be negative for CSF and/or blood. A 1-2 ml. volume of Isovue-250 was injected and flow of contrast was noted at each level. Radiographs were obtained for documentation purposes.   After attaining the desired flow of contrast documented above, a 0.5 to 1.0  ml test dose of 0.25% Marcaine was injected into each respective transforaminal space.  The patient was observed for 90 seconds post injection.  After no sensory deficits were reported, and normal lower extremity motor function was noted,   the above injectate was administered so that equal amounts of the injectate were placed at each foramen (level) into the transforaminal epidural space.   Additional Comments:  The patient tolerated the procedure well Dressing: 2 x 2 sterile gauze and Band-Aid    Post-procedure details: Patient was observed during the procedure. Post-procedure instructions were reviewed.  Patient left the clinic in stable condition.

## 2019-09-01 NOTE — Telephone Encounter (Signed)
disregard

## 2021-03-06 ENCOUNTER — Telehealth: Payer: Self-pay | Admitting: Physical Medicine and Rehabilitation

## 2021-03-06 NOTE — Telephone Encounter (Signed)
Scheduled for OV. 

## 2021-03-06 NOTE — Telephone Encounter (Signed)
Pt called stating she needs a steroid injection for her back and would like a CB to set an appt.   [phone]

## 2021-03-13 ENCOUNTER — Other Ambulatory Visit: Payer: Self-pay

## 2021-03-13 ENCOUNTER — Encounter: Payer: Self-pay | Admitting: Physical Medicine and Rehabilitation

## 2021-03-13 ENCOUNTER — Ambulatory Visit (INDEPENDENT_AMBULATORY_CARE_PROVIDER_SITE_OTHER): Payer: Medicare Other | Admitting: Physical Medicine and Rehabilitation

## 2021-03-13 VITALS — BP 122/80 | HR 81

## 2021-03-13 DIAGNOSIS — M5416 Radiculopathy, lumbar region: Secondary | ICD-10-CM | POA: Diagnosis not present

## 2021-03-13 DIAGNOSIS — M48062 Spinal stenosis, lumbar region with neurogenic claudication: Secondary | ICD-10-CM | POA: Diagnosis not present

## 2021-03-13 DIAGNOSIS — M4726 Other spondylosis with radiculopathy, lumbar region: Secondary | ICD-10-CM

## 2021-03-13 DIAGNOSIS — G8929 Other chronic pain: Secondary | ICD-10-CM

## 2021-03-13 DIAGNOSIS — M5442 Lumbago with sciatica, left side: Secondary | ICD-10-CM

## 2021-03-13 NOTE — Progress Notes (Signed)
Claire Davis - 81 y.o. female MRN [id number]  Date of birth: 14-Feb-1941  Office Visit Note: Visit Date: 03/13/2021 PCP: Alton Revere, MD Referred by: Alton Revere, MD  Subjective: Chief Complaint  Patient presents with   Lower Back - Pain   HPI: Claire Davis is a 80 y.o. female who comes in today for evaluation of chronic, worsening and severe left-sided lower back pain radiating to buttock and down  lateral knee and leg.  Patient had left L4 transforaminal epidural steroid injection in 2020 which she reports gave her substantial and sustained relief until the last several months.  Patient reports pain is exacerbated by walking, prolonged standing and lifting.  Patient describes pain as a aching sensation and is typically worse in the mornings.  Patient rates pain as a 7 out of 10 at present.  Patient states some relief of pain with Tylenol and rest.  Patient's lumbar MRI from 2018 exhibits multilevel facet hypertrophy and advanced multifactorial spinal canal stenosis at L4-L5. Patient states she is very active and helps take care of her husband whom has emphysema and significant cardiac issues. Patient states she wanted to be evaluated before her pain became unbearable. Patient denies focal weakness, numbness and tingling. Patient denies recent trauma or falls.   Review of Systems  Musculoskeletal:  Positive for back pain.  Neurological:  Negative for tingling, sensory change, focal weakness and weakness.  Otherwise per HPI.  Assessment & Plan: Visit Diagnoses:    ICD-10-CM   1. Lumbar radiculopathy  M54.16 Ambulatory referral to Physical Medicine Rehab    2. Chronic left-sided low back pain with left-sided sciatica  M54.42    G89.29     3. Spinal stenosis of lumbar region with neurogenic claudication  M48.062     4. Other spondylosis with radiculopathy, lumbar region  M47.26        Plan: Findings:  Chronic, worsening and severe left sided lower back pain radiating to buttock  and down lateral knee and ankle. Good and sustained relief from left L4 transforaminal epidural steroid injection in 2020 until recently. Patient's lumbar MRI from 2018 discussed with her today using spine model. Patient's clinical presentation and exam are consistent with classic L5 distribution. Patient has failed conservative therapies and we feel the next step is to repeat left L4 transforaminal epidural steroid injection under fluoroscopic guidance. We feel that we can get patient in quickly for injection. Patient encouraged to stay active as tolerated. We will follow-up with her after injection is completed. No red flag symptoms noted upon exam today.    Meds & Orders: No orders of the defined types were placed in this encounter.   Orders Placed This Encounter  Procedures   Ambulatory referral to Physical Medicine Rehab    Follow-up: Return in about 1 week (around 03/20/2021) for Left L4 transforaminal epidural steroid injection.   Procedures: No procedures performed      Clinical History: MRI LUMBAR SPINE WITHOUT CONTRAST   TECHNIQUE: Multiplanar, multisequence MR imaging of the lumbar spine was performed. No intravenous contrast was administered.   COMPARISON:  None.   FINDINGS: Segmentation:  Standard.   Alignment:  Grade 1 anterolisthesis at L4-5, facet mediated.   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   Conus medullaris: Extends to the T12 level and appears normal.   Paraspinal and other soft tissues: Negative.   Disc levels:   T12- L1: Disc narrowing and desiccation.  No impingement   L1-L2: Mild disc narrowing and bulging. Ventral spurring.  No impingement   L2-L3: Bi foraminal predominant disc bulging. Degenerative posterior element hypertrophy. No compressive stenosis   L3-L4: Disc bulging with left foraminal inferior protrusion. Hypertrophic facet arthropathy. Mild spinal stenosis. Asymmetric left subarticular recess narrowing. Left inferior foraminal effacement  without L3 compression.   L4-L5: Severe facet arthropathy with trace anterolisthesis and bulky posterior element hypertrophy. The disc is bulging and narrowed. Advanced spinal stenosis with no visible CSF and cauda equina redundancy. Advanced right and mild/ moderate left foraminal narrowing.   L5-S1:Disc narrowing and bulging. Facet arthropathy with asymmetric left spurring. Left more than right subarticular recess stenosis. Patent foramina.   IMPRESSION: 1. 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 2. 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 3. L3-4 moderate left foraminal narrowing. 4. L5-S1 subarticular recess narrowing which could affect either of the S1 nerve roots.     Electronically Signed   By: Marnee Spring M.D.   On: 07/22/2016 16:34   She reports that she has never smoked. She has never used smokeless tobacco. No results for input(s): HGBA1C, LABURIC in the last 8760 hours.  Objective:  VS:  HT:    WT:   BMI:     BP:122/80  HR:81bpm  TEMP: ( )  RESP:  Physical Exam HENT:     Head: Normocephalic and atraumatic.     Right Ear: External ear normal.     Left Ear: External ear normal.     Nose: Nose normal.     Mouth/Throat:     Mouth: Mucous membranes are moist.  Eyes:     Pupils: Pupils are equal, round, and reactive to light.  Cardiovascular:     Rate and Rhythm: Normal rate.     Pulses: Normal pulses.  Pulmonary:     Effort: Pulmonary effort is normal.  Abdominal:     General: Abdomen is flat. There is no distension.  Musculoskeletal:        General: Tenderness present.     Cervical back: Normal range of motion.     Comments: Pt is slow to rise from seated position to standing. Good lumbar range of motion. Strong distal strength without clonus, no pain upon palpation of greater trochanters. Sensation intact bilaterally. Dysesthesias noted to left L5 dermatome. Walks independently, gait  steady.     Skin:    General: Skin is warm and dry.     Capillary Refill: Capillary refill takes less than 2 seconds.  Neurological:     General: No focal deficit present.     Mental Status: She is alert and oriented to person, place, and time.  Psychiatric:        Mood and Affect: Mood normal.    Ortho Exam  Imaging: No results found.  Past Medical/Family/Surgical/Social History: Medications & Allergies reviewed per EMR, new medications updated. Patient Active Problem List   Diagnosis Date Noted   Spinal stenosis of lumbar region with neurogenic claudication 08/27/2018   Other spondylosis with radiculopathy, cervical region 08/27/2018   Lumbago with sciatica, left side 07/12/2016   Past Medical History:  Diagnosis Date   High cholesterol    Hypertension    Sciatica    History reviewed. No pertinent family history. Past Surgical History:  Procedure Laterality Date   CESAREAN SECTION     CHOLECYSTECTOMY     Social History   Occupational History   Not on file  Tobacco Use   Smoking status: Never   Smokeless tobacco: Never  Substance and Sexual Activity   Alcohol use: No   Drug use: Not on file   Sexual activity: Never

## 2021-03-13 NOTE — Progress Notes (Signed)
Pt state lower back pain that travels down her left leg and calf. Pt state when she getting out of bed in the morning is when she has the most pain. Pt state she takes over the counter pain meds to help ease her pain.  Numeric Pain Rating Scale and Functional Assessment Average Pain 7 Pain Right Now 5 My pain is intermittent, sharp, burning, dull, and aching Pain is worse with: walking, standing, some activites, and Laying down Pain improves with: heat/ice and medication   In the last MONTH (on 0-10 scale) has pain interfered with the following?  1. General activity like being  able to carry out your everyday physical activities such as walking, climbing stairs, carrying groceries, or moving a chair?  Rating(6)  2. Relation with others like being able to carry out your usual social activities and roles such as  activities at home, at work and in your community. Rating(7)  3. Enjoyment of life such that you have  been bothered by emotional problems such as feeling anxious, depressed or irritable?  Rating(8)

## 2021-03-22 ENCOUNTER — Ambulatory Visit: Payer: Self-pay

## 2021-03-22 ENCOUNTER — Encounter: Payer: Self-pay | Admitting: Physical Medicine and Rehabilitation

## 2021-03-22 ENCOUNTER — Ambulatory Visit (INDEPENDENT_AMBULATORY_CARE_PROVIDER_SITE_OTHER): Payer: Medicare Other | Admitting: Physical Medicine and Rehabilitation

## 2021-03-22 ENCOUNTER — Other Ambulatory Visit: Payer: Self-pay

## 2021-03-22 VITALS — BP 118/72 | HR 73

## 2021-03-22 DIAGNOSIS — M5416 Radiculopathy, lumbar region: Secondary | ICD-10-CM

## 2021-03-22 MED ORDER — METHYLPREDNISOLONE ACETATE 80 MG/ML IJ SUSP
80.0000 mg | Freq: Once | INTRAMUSCULAR | Status: AC
  Administered 2021-03-22: 80 mg

## 2021-03-22 NOTE — Progress Notes (Signed)
Pt state lower back pain that travels down her left legs. Pt state walking makes the pain worse. Pt state she takes over the counter pain meds to help ease her pain.  Numeric Pain Rating Scale and Functional Assessment Average Pain 4   In the last MONTH (on 0-10 scale) has pain interfered with the following?  1. General activity like being  able to carry out your everyday physical activities such as walking, climbing stairs, carrying groceries, or moving a chair?  Rating(7)   +Driver, -BT, -Dye Allergies.

## 2021-03-22 NOTE — Patient Instructions (Signed)

## 2021-04-03 NOTE — Progress Notes (Signed)
Claire Davis - 80 y.o. female MRN [id number]  Date of birth: 12/21/1940  Office Visit Note: Visit Date: 03/22/2021 PCP: Alton Revere, MD Referred by: Alton Revere, MD  Subjective: Chief Complaint  Patient presents with   Lower Back - Pain   Left Leg - Pain   HPI:  Claire Davis is a 80 y.o. female who comes in today at the request of Ellin Goodie, FNP for planned Left L4-5 Lumbar Transforaminal epidural steroid injection with fluoroscopic guidance.  The patient has failed conservative care including home exercise, medications, time and activity modification.  This injection will be diagnostic and hopefully therapeutic.  Please see requesting physician notes for further details and justification.   ROS Otherwise per HPI.  Assessment & Plan: Visit Diagnoses:    ICD-10-CM   1. Lumbar radiculopathy  M54.16 XR C-ARM NO REPORT    Epidural Steroid injection    methylPREDNISolone acetate (DEPO-MEDROL) injection 80 mg      Plan: No additional findings.   Meds & Orders:  Meds ordered this encounter  Medications   methylPREDNISolone acetate (DEPO-MEDROL) injection 80 mg    Orders Placed This Encounter  Procedures   XR C-ARM NO REPORT   Epidural Steroid injection    Follow-up: Return if symptoms worsen or fail to improve.   Procedures: No procedures performed  Lumbosacral Transforaminal Epidural Steroid Injection - Sub-Pedicular Approach with Fluoroscopic Guidance  Patient: Claire Davis      Date of Birth: 02-Jun-1941 MRN: [id number] PCP: Alton Revere, MD      Visit Date: 03/22/2021   Universal Protocol:    Date/Time: 03/22/2021  Consent Given By: the patient  Position: PRONE  Additional Comments: Vital signs were monitored before and after the procedure. Patient was prepped and draped in the usual sterile fashion. The correct patient, procedure, and site was verified.   Injection Procedure Details:   Procedure diagnoses: Lumbar radiculopathy [M54.16]     Meds Administered:  Meds ordered this encounter  Medications   methylPREDNISolone acetate (DEPO-MEDROL) injection 80 mg    Laterality: Left  Location/Site: L4  Needle:5.0 in., 22 ga.  Short bevel or Quincke spinal needle  Needle Placement: Transforaminal  Findings:    -Comments: Excellent flow of contrast along the nerve, nerve root and into the epidural space. She did have some pain during delivery medication in an expected dermatomal or root pattern.  Procedure Details: After squaring off the end-plates to get a true AP view, the C-arm was positioned so that an oblique view of the foramen as noted above was visualized. The target area is just inferior to the "nose of the scotty dog" or sub pedicular. The soft tissues overlying this structure were infiltrated with 2-3 ml. of 1% Lidocaine without Epinephrine.  The spinal needle was inserted toward the target using a "trajectory" view along the fluoroscope beam.  Under AP and lateral visualization, the needle was advanced so it did not puncture dura and was located close the 6 O'Clock position of the pedical in AP tracterory. Biplanar projections were used to confirm position. Aspiration was confirmed to be negative for CSF and/or blood. A 1-2 ml. volume of Isovue-250 was injected and flow of contrast was noted at each level. Radiographs were obtained for documentation purposes.   After attaining the desired flow of contrast documented above, a 0.5 to 1.0 ml test dose of 0.25% Marcaine was injected into each respective transforaminal space.  The patient was observed for 90 seconds post injection.  After no sensory deficits  were reported, and normal lower extremity motor function was noted,   the above injectate was administered so that equal amounts of the injectate were placed at each foramen (level) into the transforaminal epidural space.   Additional Comments:  No complications occurred Dressing: 2 x 2 sterile gauze and Band-Aid     Post-procedure details: Patient was observed during the procedure. Post-procedure instructions were reviewed.  Patient left the clinic in stable condition.     Clinical History: MRI LUMBAR SPINE WITHOUT CONTRAST   TECHNIQUE: Multiplanar, multisequence MR imaging of the lumbar spine was performed. No intravenous contrast was administered.   COMPARISON:  None.   FINDINGS: Segmentation:  Standard.   Alignment:  Grade 1 anterolisthesis at L4-5, facet mediated.   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   Conus medullaris: Extends to the T12 level and appears normal.   Paraspinal and other soft tissues: Negative.   Disc levels:   T12- L1: Disc narrowing and desiccation.  No impingement   L1-L2: Mild disc narrowing and bulging. Ventral spurring. No impingement   L2-L3: Bi foraminal predominant disc bulging. Degenerative posterior element hypertrophy. No compressive stenosis   L3-L4: Disc bulging with left foraminal inferior protrusion. Hypertrophic facet arthropathy. Mild spinal stenosis. Asymmetric left subarticular recess narrowing. Left inferior foraminal effacement without L3 compression.   L4-L5: Severe facet arthropathy with trace anterolisthesis and bulky posterior element hypertrophy. The disc is bulging and narrowed. Advanced spinal stenosis with no visible CSF and cauda equina redundancy. Advanced right and mild/ moderate left foraminal narrowing.   L5-S1:Disc narrowing and bulging. Facet arthropathy with asymmetric left spurring. Left more than right subarticular recess stenosis. Patent foramina.   IMPRESSION: 1. 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 2. 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 3. L3-4 moderate left foraminal narrowing. 4. L5-S1 subarticular recess narrowing which could affect either of the S1 nerve roots.     Electronically Signed   By: Marnee Spring M.D.   On: 07/22/2016  16:34     Objective:  VS:  HT:    WT:   BMI:     BP:118/72  HR:73bpm  TEMP: ( )  RESP:  Physical Exam Vitals and nursing note reviewed.  Constitutional:      General: She is not in acute distress.    Appearance: Normal appearance. She is not ill-appearing.  HENT:     Head: Normocephalic and atraumatic.     Right Ear: External ear normal.     Left Ear: External ear normal.  Eyes:     Extraocular Movements: Extraocular movements intact.  Cardiovascular:     Rate and Rhythm: Normal rate.     Pulses: Normal pulses.  Pulmonary:     Effort: Pulmonary effort is normal. No respiratory distress.  Abdominal:     General: There is no distension.     Palpations: Abdomen is soft.  Musculoskeletal:        General: Tenderness present.     Cervical back: Neck supple.     Right lower leg: No edema.     Left lower leg: No edema.     Comments: Patient has good distal strength with no pain over the greater trochanters.  No clonus or focal weakness.  Skin:    Findings: No erythema, lesion or rash.  Neurological:     General: No focal deficit present.     Mental Status: She is alert and oriented to person, place, and time.  Sensory: No sensory deficit.     Motor: No weakness or abnormal muscle tone.     Coordination: Coordination normal.  Psychiatric:        Mood and Affect: Mood normal.        Behavior: Behavior normal.     Imaging: No results found.

## 2021-04-03 NOTE — Procedures (Signed)
Lumbosacral Transforaminal Epidural Steroid Injection - Sub-Pedicular Approach with Fluoroscopic Guidance  Patient: Claire Davis      Date of Birth: [birth year] MRN: [id number] PCP: Alton Revere, MD      Visit Date: 03/22/2021   Universal Protocol:    Date/Time: 03/22/2021  Consent Given By: the patient  Position: PRONE  Additional Comments: Vital signs were monitored before and after the procedure. Patient was prepped and draped in the usual sterile fashion. The correct patient, procedure, and site was verified.   Injection Procedure Details:   Procedure diagnoses: Lumbar radiculopathy [M54.16]    Meds Administered:  Meds ordered this encounter  Medications   methylPREDNISolone acetate (DEPO-MEDROL) injection 80 mg    Laterality: Left  Location/Site: L4  Needle:5.0 in., 22 ga.  Short bevel or Quincke spinal needle  Needle Placement: Transforaminal  Findings:    -Comments: Excellent flow of contrast along the nerve, nerve root and into the epidural space. She did have some pain during delivery medication in an expected dermatomal or root pattern.  Procedure Details: After squaring off the end-plates to get a true AP view, the C-arm was positioned so that an oblique view of the foramen as noted above was visualized. The target area is just inferior to the "nose of the scotty dog" or sub pedicular. The soft tissues overlying this structure were infiltrated with 2-3 ml. of 1% Lidocaine without Epinephrine.  The spinal needle was inserted toward the target using a "trajectory" view along the fluoroscope beam.  Under AP and lateral visualization, the needle was advanced so it did not puncture dura and was located close the 6 O'Clock position of the pedical in AP tracterory. Biplanar projections were used to confirm position. Aspiration was confirmed to be negative for CSF and/or blood. A 1-2 ml. volume of Isovue-250 was injected and flow of contrast was noted at each level.  Radiographs were obtained for documentation purposes.   After attaining the desired flow of contrast documented above, a 0.5 to 1.0 ml test dose of 0.25% Marcaine was injected into each respective transforaminal space.  The patient was observed for 90 seconds post injection.  After no sensory deficits were reported, and normal lower extremity motor function was noted,   the above injectate was administered so that equal amounts of the injectate were placed at each foramen (level) into the transforaminal epidural space.   Additional Comments:  No complications occurred Dressing: 2 x 2 sterile gauze and Band-Aid    Post-procedure details: Patient was observed during the procedure. Post-procedure instructions were reviewed.  Patient left the clinic in stable condition.
